# Patient Record
Sex: Male | Born: 1995 | Race: Black or African American | Hispanic: No | Marital: Single | Smoking: Current every day smoker
Health system: Southern US, Community
[De-identification: ages and names within clinical notes are randomized; demographics above are authoritative.]

## PROBLEM LIST (undated history)

## (undated) DIAGNOSIS — J45909 Unspecified asthma, uncomplicated: Secondary | ICD-10-CM

---
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
## 2013-12-26 ENCOUNTER — Encounter (HOSPITAL_COMMUNITY): Payer: Self-pay | Admitting: Emergency Medicine

## 2013-12-26 ENCOUNTER — Emergency Department (INDEPENDENT_AMBULATORY_CARE_PROVIDER_SITE_OTHER)
Admission: EM | Admit: 2013-12-26 | Discharge: 2013-12-26 | Disposition: A | Discharge status: Home / Self Care | Payer: Self-pay | Source: Home / Self Care | Attending: Emergency Medicine | Admitting: Emergency Medicine

## 2013-12-26 DIAGNOSIS — H6590 Unspecified nonsuppurative otitis media, unspecified ear: Secondary | ICD-10-CM

## 2013-12-26 DIAGNOSIS — H6591 Unspecified nonsuppurative otitis media, right ear: Secondary | ICD-10-CM

## 2013-12-26 MED ORDER — AMOXICILLIN 500 MG PO CAPS: 1000.0000 mg | ORAL_CAPSULE | Freq: Three times a day (TID) | ORAL | Status: AC

## 2013-12-26 MED ORDER — PREDNISONE 20 MG PO TABS: 20.0000 mg | ORAL_TABLET | Freq: Two times a day (BID) | ORAL | Status: AC

## 2013-12-26 MED ORDER — FLUTICASONE PROPIONATE 50 MCG/ACT NA SUSP: 2.0000 | Freq: Every day | NASAL | Status: AC

## 2013-12-26 NOTE — Discharge Instructions (Signed)
Otitis Media, Adult Otitis media is redness, soreness, and swelling (inflammation) of the middle ear. Otitis media may be caused by allergies or, most commonly, by infection. Often it occurs as a complication of the common cold. SIGNS AND SYMPTOMS Symptoms of otitis media may include:  Earache.  Fever.  Ringing in your ear.  Headache.  Leakage of fluid from the ear. DIAGNOSIS To diagnose otitis media, your health care provider will examine your ear with an otoscope. This is an instrument that allows your health care provider to see into your ear in order to examine your eardrum. Your health care provider also will ask you questions about your symptoms. TREATMENT  Typically, otitis media resolves on its own within 3 5 days. Your health care provider may prescribe medicine to ease your symptoms of pain. If otitis media does not resolve within 5 days or is recurrent, your health care provider may prescribe antibiotic medicines if he or she suspects that a bacterial infection is the cause. HOME CARE INSTRUCTIONS   Take your medicine as directed until it is gone, even if you feel better after the first few days.  Only take over-the-counter or prescription medicines for pain, discomfort, or fever as directed by your health care provider.  Follow up with your health care provider as directed. SEEK MEDICAL CARE IF:  You have otitis media only in one ear or bleeding from your nose or both.  You notice a lump on your neck.  You are not getting better in 3 5 days.  You feel worse instead of better. SEEK IMMEDIATE MEDICAL CARE IF:   You have pain that is not controlled with medicine.  You have swelling, redness, or pain around your ear or stiffness in your neck.  You notice that part of your face is paralyzed.  You notice that the bone behind your ear (mastoid) is tender when you touch it. MAKE SURE YOU:   Understand these instructions.  Will watch your condition.  Will get help  right away if you are not doing well or get worse. Document Released: 07/15/2004 Document Revised: 07/31/2013 Document Reviewed: 05/07/2013 [employer] Patient Information 2014 [employer].  

## 2013-12-26 NOTE — ED Notes (Signed)
Right ear pain for one week, denies any other uri symptom

## 2013-12-26 NOTE — ED Provider Notes (Signed)
  Chief Complaint   Chief Complaint  Patient presents with  . Otalgia    History of Present Illness   Dietrich PatesBobby J Behne is a 18 year old male who's had a one-week history of right ear pain and decreased hearing. He denies any drainage from the ear or recently getting water in the ear. He's had no nasal congestion, rhinorrhea, sneezing, or allergy history. He denies fever, chills, headache, sore throat, adenopathy, or cough. He has not had problems with his ear previously.  Review of Systems   Other than as noted above, the patient denies any of the following symptoms: Systemic:  No fevers, chills, or sweats. Eye:  No redness, pain, discharge, itching, blurred vision, or diplopia. ENT:  No headache, nasal congestion, sneezing, itching, epistaxis, ear pain, congestion, decreased hearing, ringing in ears, vertigo, or tinnitus.  No oral lesions, sore throat, pain on swallowing, or hoarseness. Neck:  No mass, tenderness or adenopathy. Lungs:  No coughing, wheezing, or shortness of breath. Skin:  No rash or itching.  PMFSH   Past medical history, family history, social history, meds, and allergies were reviewed.   Physical Examination     Vital signs:  BP 104/62  Pulse 64  Temp(Src) 97.9 F (36.6 C) (Oral)  Resp 14  SpO2 100% General:  Alert and oriented.  In no distress.  Skin warm and dry. Eye:  PERRL, full EOMs, lids and conjunctiva normal.   ENT:  His right TM was slightly erythematous and dull, and there is fluid behind the TM, no air-fluid level, the left TM was normal. Both canals were clear.  Nasal mucosa not congested and without drainage.  Mucous membranes moist, no oral lesions, normal dentition, pharynx clear.  No cranial or facial pain to palplation. Neck:  Supple, full ROM.  No adenopathy, tenderness or mass.  Thyroid normal. Lungs:  Breath sounds clear and equal bilaterally.  No wheezes, rales or rhonchi. Heart:  Rhythm regular, without extrasystoles.  No gallops or  murmers. Skin:  Clear, warm and dry.  Assessment   The encounter diagnosis was Right serous otitis media.  Plan    1.  Meds:  The following meds were prescribed:   New Prescriptions   AMOXICILLIN (AMOXIL) 500 MG CAPSULE    Take 2 capsules (1,000 mg total) by mouth 3 (three) times daily.   FLUTICASONE (FLONASE) 50 MCG/ACT NASAL SPRAY    Place 2 sprays into both nostrils daily.   PREDNISONE (DELTASONE) 20 MG TABLET    Take 1 tablet (20 mg total) by mouth 2 (two) times daily.    2.  Patient Education/Counseling:  The patient was given appropriate handouts, self care instructions, and instructed in symptomatic relief.  She instructed in Polizeration.  3.  Follow up:  The patient was told to follow up here if no better in 3 to 4 days, or sooner if becoming worse in any way, and given some red flag symptoms such as worsening pain or fever which would prompt immediate return.  Followup with Dr. Narda Bondshris Newman if no better in a month.     Reuben Likesavid C Raziel Koenigs, MD 12/26/13 [phone]

## 2016-03-29 ENCOUNTER — Emergency Department (HOSPITAL_COMMUNITY): Payer: Managed Care, Other (non HMO)

## 2016-03-29 ENCOUNTER — Encounter (HOSPITAL_COMMUNITY): Payer: Self-pay | Admitting: *Deleted

## 2016-03-29 ENCOUNTER — Emergency Department (HOSPITAL_COMMUNITY)
Admission: EM | Admit: 2016-03-29 | Discharge: 2016-03-29 | Disposition: A | Discharge status: Home / Self Care | Payer: Managed Care, Other (non HMO) | Attending: Emergency Medicine | Admitting: Emergency Medicine

## 2016-03-29 DIAGNOSIS — S0081XA Abrasion of other part of head, initial encounter: Secondary | ICD-10-CM | POA: Insufficient documentation

## 2016-03-29 DIAGNOSIS — Z7951 Long term (current) use of inhaled steroids: Secondary | ICD-10-CM | POA: Insufficient documentation

## 2016-03-29 DIAGNOSIS — Z79899 Other long term (current) drug therapy: Secondary | ICD-10-CM | POA: Diagnosis not present

## 2016-03-29 DIAGNOSIS — Z7952 Long term (current) use of systemic steroids: Secondary | ICD-10-CM | POA: Insufficient documentation

## 2016-03-29 DIAGNOSIS — Y998 Other external cause status: Secondary | ICD-10-CM | POA: Insufficient documentation

## 2016-03-29 DIAGNOSIS — J45909 Unspecified asthma, uncomplicated: Secondary | ICD-10-CM | POA: Diagnosis not present

## 2016-03-29 DIAGNOSIS — Z23 Encounter for immunization: Secondary | ICD-10-CM | POA: Diagnosis not present

## 2016-03-29 DIAGNOSIS — Y9389 Activity, other specified: Secondary | ICD-10-CM | POA: Insufficient documentation

## 2016-03-29 DIAGNOSIS — S61531A Puncture wound without foreign body of right wrist, initial encounter: Secondary | ICD-10-CM | POA: Insufficient documentation

## 2016-03-29 DIAGNOSIS — Y9289 Other specified places as the place of occurrence of the external cause: Secondary | ICD-10-CM | POA: Insufficient documentation

## 2016-03-29 DIAGNOSIS — F17200 Nicotine dependence, unspecified, uncomplicated: Secondary | ICD-10-CM | POA: Insufficient documentation

## 2016-03-29 DIAGNOSIS — S61411A Laceration without foreign body of right hand, initial encounter: Secondary | ICD-10-CM | POA: Insufficient documentation

## 2016-03-29 DIAGNOSIS — Z792 Long term (current) use of antibiotics: Secondary | ICD-10-CM | POA: Diagnosis not present

## 2016-03-29 DIAGNOSIS — S61451A Open bite of right hand, initial encounter: Secondary | ICD-10-CM | POA: Diagnosis present

## 2016-03-29 HISTORY — DX: Unspecified asthma, uncomplicated: J45.909

## 2016-03-29 MED ORDER — BACITRACIN ZINC 500 UNIT/GM EX OINT: 1.0000 "application " | TOPICAL_OINTMENT | Freq: Two times a day (BID) | CUTANEOUS | Status: AC

## 2016-03-29 MED ORDER — TETANUS-DIPHTH-ACELL PERTUSSIS 5-2.5-18.5 LF-MCG/0.5 IM SUSP
0.5000 mL | Freq: Once | INTRAMUSCULAR | Status: AC
  Administered 2016-03-29: 0.5 mL via INTRAMUSCULAR
  Filled 2016-03-29: qty 0.5

## 2016-03-29 MED ORDER — LIDOCAINE HCL (PF) 1 % IJ SOLN
5.0000 mL | Freq: Once | INTRAMUSCULAR | Status: AC
  Administered 2016-03-29: 5 mL
  Filled 2016-03-29: qty 5

## 2016-03-29 NOTE — Discharge Instructions (Signed)
Laceration Care, Adult °A laceration is a cut that goes through all of the layers of the skin and into the tissue that is right under the skin. Some lacerations heal on their own. Others need to be closed with stitches (sutures), staples, skin adhesive strips, or skin glue. Proper laceration care minimizes the risk of infection and helps the laceration to heal better. °HOW TO CARE FOR YOUR LACERATION °If sutures or staples were used: °· Keep the wound clean and dry. °· If you were given a bandage (dressing), you should change it at least one time per day or as told by your health care provider. You should also change it if it becomes wet or dirty. °· Keep the wound completely dry for the first 24 hours or as told by your health care provider. After that time, you may shower or bathe. However, make sure that the wound is not soaked in water until after the sutures or staples have been removed. °· Clean the wound one time each day or as told by your health care provider: °· Wash the wound with soap and water. °· Rinse the wound with water to remove all soap. °· Pat the wound dry with a clean towel. Do not rub the wound. °· After cleaning the wound, apply a thin layer of antibiotic ointment as told by your health care provider. This will help to prevent infection and keep the dressing from sticking to the wound. °· Have the sutures or staples removed as told by your health care provider. °If skin adhesive strips were used: °· Keep the wound clean and dry. °· If you were given a bandage (dressing), you should change it at least one time per day or as told by your health care provider. You should also change it if it becomes dirty or wet. °· Do not get the skin adhesive strips wet. You may shower or bathe, but be careful to keep the wound dry. °· If the wound gets wet, pat it dry with a clean towel. Do not rub the wound. °· Skin adhesive strips fall off on their own. You may trim the strips as the wound heals. Do not  remove skin adhesive strips that are still stuck to the wound. They will fall off in time. °If skin glue was used: °· Try to keep the wound dry, but you may briefly wet it in the shower or bath. Do not soak the wound in water, such as by swimming. °· After you have showered or bathed, gently pat the wound dry with a clean towel. Do not rub the wound. °· Do not do any activities that will make you sweat heavily until the skin glue has fallen off on its own. °· Do not apply liquid, cream, or ointment medicine to the wound while the skin glue is in place. Using those may loosen the film before the wound has healed. °· If you were given a bandage (dressing), you should change it at least one time per day or as told by your health care provider. You should also change it if it becomes dirty or wet. °· If a dressing is placed over the wound, be careful not to apply tape directly over the skin glue. Doing that may cause the glue to be pulled off before the wound has healed. °· Do not pick at the glue. The skin glue usually remains in place for 5-10 days, then it falls off of the skin. °General Instructions °· Take over-the-counter and prescription   medicines only as told by your health care provider.  If you were prescribed an antibiotic medicine or ointment, take or apply it as told by your doctor. Do not stop using it even if your condition improves.  To help prevent scarring, make sure to cover your wound with sunscreen whenever you are outside after stitches are removed, after adhesive strips are removed, or when glue remains in place and the wound is healed. Make sure to wear a sunscreen of at least 30 SPF.  Do not scratch or pick at the wound.  Keep all follow-up visits as told by your health care provider. This is important.  Check your wound every day for signs of infection. Watch for:  Redness, swelling, or pain.  Fluid, blood, or pus.  Raise (elevate) the injured area above the level of your heart  while you are sitting or lying down, if possible. SEEK MEDICAL CARE IF:  You received a tetanus shot and you have swelling, severe pain, redness, or bleeding at the injection site.  You have a fever.  A wound that was closed breaks open.  You notice a bad smell coming from your wound or your dressing.  You notice something coming out of the wound, such as wood or glass.  Your pain is not controlled with medicine.  You have increased redness, swelling, or pain at the site of your wound.  You have fluid, blood, or pus coming from your wound.  You notice a change in the color of your skin near your wound.  You need to change the dressing frequently due to fluid, blood, or pus draining from the wound.  You develop a new rash.  You develop numbness around the wound. SEEK IMMEDIATE MEDICAL CARE IF:  You develop severe swelling around the wound.  Your pain suddenly increases and is severe.  You develop painful lumps near the wound or on skin that is anywhere on your body.  You have a red streak going away from your wound.  The wound is on your hand or foot and you cannot properly move a finger or toe.  The wound is on your hand or foot and you notice that your fingers or toes look pale or bluish.   This information is not intended to replace advice given to you by your health care provider. Make sure you discuss any questions you have with your health care provider.   Document Released: 10/10/2005 Document Revised: 02/24/2015 Document Reviewed: 10/06/2014 Elsevier Interactive Patient Education 2016 [employer] Human Bite Human bite wounds tend to become infected, even when they seem minor at first. Infection can develop quickly, sometimes in a matter of hours. Bite wounds of the hand have a higher chance of infection compared to bites in other places and can be serious because the tendons and joints are close to the skin. SYMPTOMS  Common symptoms of a human bite include:     Bruising.   Broken skin.  Bleeding.  Pain. DIAGNOSIS  This condition may be diagnosed based on a physical exam and medical history. Your health care provider will examine the wound and ask for details about how the bite happened. If you have details about the medical history of the person who bit you, it is important to tell your health care provider. This will help determine if there is any chance that a disease may have been spread. You may have tests, such as:   Blood tests. This may be done if there is a  chance of infection from diseases such as hepatitis or HIV.  X-rays to check for damage to bones or joints.  Culture test. This uses a sample of fluid from the wound to check for infection. TREATMENT  Treatment varies based on the location and severity of the bite and your medical history. Treatment may include:   Wound care. This often includes cleaning the wound, flushing the wound with saline solution, and applying a bandage (dressing). Sometimes, the wound is left open to heal because of the high risk of infection. However, in some cases, the wound may be closed with stitches (sutures), staples, skin glue, or adhesive strips.  Antibiotic medicine.  A flexible cast (splint).  Tetanus shot. In some cases, bites that have become infected may require IV antibiotics and surgical treatment in the hospital.  HOME CARE INSTRUCTIONS Wound Care  Follow instructions from your health care provider about how to take care of your wound. Make sure you:  Wash your hands with soap and water before you change your dressing. If soap and water are not available, use hand sanitizer.  Change your dressing as told by your health care provider.  Leave sutures, skin glue, or adhesive strips in place. These skin closures may need to be in place for 2 weeks or longer. If adhesive strip edges start to loosen and curl up, you may trim the loose edges. Do not remove adhesive strips completely  unless your health care provider tells you to do that.  Check your wound every day for signs of infection. Watch for:  Increasing redness, swelling, or pain.  Fluid, blood, or pus. General Instructions  Take or apply over-the-counter and prescription medicines only as told by your health care provider.  If you were prescribed an antibiotic, take or apply it as told by your health care provider. Do not stop using the antibiotic even if your condition improves.  Keep the injured area raised (elevated) above the level of your heart while you are sitting or lying down, if this is possible.  If directed, apply ice to the injured area:  Put ice in a plastic bag.  Place a towel between your skin and the bag.  Leave the ice on for 20 minutes, 2-3 times per day.  Keep all follow-up visits as told by your health care provider. This is important. SEEK MEDICAL CARE IF:  You have chills.   You have pain when you move your injured area.  You have trouble moving your injured area.   You are not improving, or you are getting worse.  SEEK IMMEDIATE MEDICAL CARE IF:  You have increasing fluid, blood, or pus coming from your wound.  You have increasing redness, swelling, or pain at the site of your wound.   You have a red streak extending away from your wound.  You have a fever.    This information is not intended to replace advice given to you by your health care provider. Make sure you discuss any questions you have with your health care provider.   Document Released: 11/17/2004 Document Revised: 07/01/2015 Document Reviewed: 02/25/2015 Elsevier Interactive Patient Education [employer]

## 2016-03-29 NOTE — ED Notes (Signed)
Pt states he understands instructions. Discharge with [facility], Ambulatory.
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
## 2016-03-29 NOTE — ED Notes (Signed)
EDP at bedside  
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
## 2016-03-29 NOTE — ED Notes (Addendum)
PA-C to see patient before RN assessment. See PA note.

## 2016-06-16 ENCOUNTER — Emergency Department (HOSPITAL_COMMUNITY)
Admission: EM | Admit: 2016-06-16 | Discharge: 2016-06-17 | Disposition: A | Discharge status: Home / Self Care | Payer: Managed Care, Other (non HMO) | Attending: Emergency Medicine | Admitting: Emergency Medicine

## 2016-06-16 ENCOUNTER — Encounter (HOSPITAL_COMMUNITY): Payer: Self-pay

## 2016-06-16 ENCOUNTER — Emergency Department (HOSPITAL_COMMUNITY): Payer: Managed Care, Other (non HMO)

## 2016-06-16 DIAGNOSIS — X501XXA Overexertion from prolonged static or awkward postures, initial encounter: Secondary | ICD-10-CM | POA: Insufficient documentation

## 2016-06-16 DIAGNOSIS — S92251A Displaced fracture of navicular [scaphoid] of right foot, initial encounter for closed fracture: Secondary | ICD-10-CM | POA: Diagnosis not present

## 2016-06-16 DIAGNOSIS — J45909 Unspecified asthma, uncomplicated: Secondary | ICD-10-CM | POA: Insufficient documentation

## 2016-06-16 DIAGNOSIS — F17200 Nicotine dependence, unspecified, uncomplicated: Secondary | ICD-10-CM | POA: Diagnosis not present

## 2016-06-16 DIAGNOSIS — Y929 Unspecified place or not applicable: Secondary | ICD-10-CM | POA: Insufficient documentation

## 2016-06-16 DIAGNOSIS — Y999 Unspecified external cause status: Secondary | ICD-10-CM | POA: Diagnosis not present

## 2016-06-16 DIAGNOSIS — Y9339 Activity, other involving climbing, rappelling and jumping off: Secondary | ICD-10-CM | POA: Diagnosis not present

## 2016-06-16 DIAGNOSIS — S99921A Unspecified injury of right foot, initial encounter: Secondary | ICD-10-CM | POA: Diagnosis present

## 2016-06-16 DIAGNOSIS — M79671 Pain in right foot: Secondary | ICD-10-CM

## 2016-06-16 NOTE — ED Triage Notes (Signed)
Pt states that he went for a run and twisted his foot wrong. Swelling to the upper foot. Pain 5/10

## 2016-06-17 MED ORDER — HYDROCODONE-ACETAMINOPHEN 5-325 MG PO TABS
2.0000 | ORAL_TABLET | Freq: Once | ORAL | Status: AC
  Administered 2016-06-17: 2 via ORAL
  Filled 2016-06-17: qty 2

## 2016-06-17 MED ORDER — HYDROCODONE-ACETAMINOPHEN 5-325 MG PO TABS: 1.0000 | ORAL_TABLET | ORAL | 0 refills | Status: AC | PRN

## 2016-06-17 NOTE — ED Notes (Signed)
Ortho tech in room with pt 

## 2016-06-17 NOTE — ED Notes (Signed)
Ortho in room

## 2016-06-17 NOTE — ED Provider Notes (Signed)
[facility]-EMERGENCY DEPT Provider Note   CSN: [id number] Arrival date & time: 06/16/16  2231     History   Chief Complaint Chief Complaint  Patient presents with  . Foot Pain    HPI Wayne Fowler is a 20 y.o. male.  HPI   Patient is a 20 year old male who presents the emergency department with right foot pain that occurred after jumping out of a tree roughly 30 mins prior to arrival. Patient landed with his foot inverted. He was able to walk but 20 minutes later began to experience pain. At rest pain is constant, throbbing, 7/10. Pain worse with walking, sharp, 10/10. Associated swelling. Patient denies other symptoms.  Past Medical History:  Diagnosis Date  . Asthma     There are no active problems to display for this patient.   History reviewed. No pertinent surgical history.     Home Medications    Prior to Admission medications   Medication Sig Start Date End Date Taking? Authorizing Provider  albuterol (PROVENTIL HFA;VENTOLIN HFA) 108 (90 BASE) MCG/ACT inhaler Inhale into the lungs every 6 (six) hours as needed for wheezing or shortness of breath.    Historical Provider, MD  amoxicillin (AMOXIL) 500 MG capsule Take 2 capsules (1,000 mg total) by mouth 3 (three) times daily. 12/26/13   Reuben Likes, MD  bacitracin ointment Apply 1 application topically 2 (two) times daily. 03/29/16   Everlene Farrier, PA-C  fluticasone (FLONASE) 50 MCG/ACT nasal spray Place 2 sprays into both nostrils daily. 12/26/13   Reuben Likes, MD  HYDROcodone-acetaminophen (NORCO/VICODIN) 5-325 MG tablet Take 1 tablet by mouth every 4 (four) hours as needed. 06/17/16   Jerre Simon, PA  predniSONE (DELTASONE) 20 MG tablet Take 1 tablet (20 mg total) by mouth 2 (two) times daily. 12/26/13   Reuben Likes, MD    Family History No family history on file.  Social History Social History  Substance Use Topics  . Smoking status: Current Every Day Smoker  . Smokeless tobacco: Never Used  . Alcohol  use No     Allergies   Review of patient's allergies indicates no known allergies.   Review of Systems Review of Systems  Constitutional: Negative for fever.  Gastrointestinal: Negative for nausea and vomiting.  Musculoskeletal: Positive for arthralgias and joint swelling.  Skin: Negative for wound.  Neurological: Negative for weakness and numbness.     Physical Exam Updated Vital Signs BP 121/76   Pulse 91   Temp 98.1 F (36.7 C) (Oral)   Resp 16   Ht 6\' 2"  (1.88 m)   Wt 73.5 kg   SpO2 100%   BMI 20.80 kg/m   Physical Exam  Constitutional: He appears well-developed and well-nourished. No distress.  HENT:  Head: Normocephalic and atraumatic.  Eyes: Conjunctivae are normal.  Pulmonary/Chest: Effort normal. No respiratory distress.  Musculoskeletal: Normal range of motion.  Examination of right foot revealed moderate edema with mild ecchymosis noted to the lateral aspect of right foot just distal to the lateral malleoli, no deformity. Limited range of motion due to pain. Sensation intact. Patient neurovascularly intact distally  Neurological: He is alert. Coordination normal.  Skin: Skin is warm and dry. He is not diaphoretic.  Psychiatric: He has a normal mood and affect. His behavior is normal.  Nursing note and vitals reviewed.    ED Treatments / Results  Labs (all labs ordered are listed, but only abnormal results are displayed) Labs Reviewed - No data to display  EKG  EKG Interpretation None       Radiology Dg Foot Complete Right  Result Date: 06/16/2016 CLINICAL DATA:  Generalized right foot pain after injury running today. Initial encounter. EXAM: RIGHT FOOT COMPLETE - 3+ VIEW COMPARISON:  None. FINDINGS: There is a bone fragment dorsal to the navicular which has irregular margins and probable donor site. Regional soft tissues are thickened/edematous. No acute malalignment. Hallux valgus. IMPRESSION: Dorsal navicular avulsion fracture. Electronically  Signed   By: Marnee SpringJonathon  Watts M.D.   On: 06/16/2016 23:08    Procedures Procedures (including critical care time)  Medications Ordered in ED Medications  HYDROcodone-acetaminophen (NORCO/VICODIN) 5-325 MG per tablet 2 tablet (2 tablets Oral Given 06/17/16 0036)     Initial Impression / Assessment and Plan / ED Course  I have reviewed the triage vital signs and the nursing notes.  Pertinent labs & imaging results that were available during my care of the patient were reviewed by me and considered in my medical decision making (see chart for details).  Clinical Course   Patient with dorsal navicular avulsion fracture. Patient neurovascularly intact distally. Placed patient in a cam walker boot and given crutches and instructed to be nonweightbearing. Discussed RICE and pain medication. Instructed patient to follow up with orthopedics, Dr. Magnus IvanBlackman, tomorrow. Discussed strict return precautions. Patient expressed understanding to the discharge instructions.  Patient case discussed with Dr. Patria Maneampos who agrees with the above plan.  Final Clinical Impressions(s) / ED Diagnoses   Final diagnoses:  Avulsion fracture of navicular bone of foot, right, closed, initial encounter  Right foot pain    New Prescriptions New Prescriptions   HYDROCODONE-ACETAMINOPHEN (NORCO/VICODIN) 5-325 MG TABLET    Take 1 tablet by mouth every 4 (four) hours as needed.     Jerre SimonJessica L Neshia Mckenzie, PA 06/17/16 [id number]    Azalia BilisKevin Campos, MD 06/17/16 [phone]

## 2016-06-17 NOTE — Discharge Instructions (Signed)
You were seen today for a dorsal navicular avulsion fracture. Wear the cam walker boot at all times except to shower. Do not bear weight on your right foot. Use crutches at all times when walking. Use ice on your foot for swelling and pain as often as you can, 20 minutes on, 20 minutes off, and be sure to keep a thin cloth between your skin and the ice. Try to keep your foot elevated as often as you can. Take Norco as needed for pain. Take a stool softener because Norco can cause constipation.  Follow-up with Dr. Doroteo GlassmanBlackmon's office tomorrow to schedule an appointment to be seen as soon as possible. Return to the emergency department if you experience uncontrolled pain, fever, numbness in your foot or any other concerning symptoms.

## 2016-06-17 NOTE — ED Notes (Addendum)
Pt verbalized understanding of d/c instructions and has no further questions. Pt is stable, A&Ox4, VSS. Pt girlfriend driving home.

## 2016-10-01 IMAGING — CR DG FOOT COMPLETE 3+V*R*
3 series · 3 of 3 positions shown · non-contrast
Comparison: None.

CLINICAL DATA: Generalized right foot pain after injury running
today. Initial encounter.

EXAM:
RIGHT FOOT COMPLETE - 3+ VIEW

[foot ap]
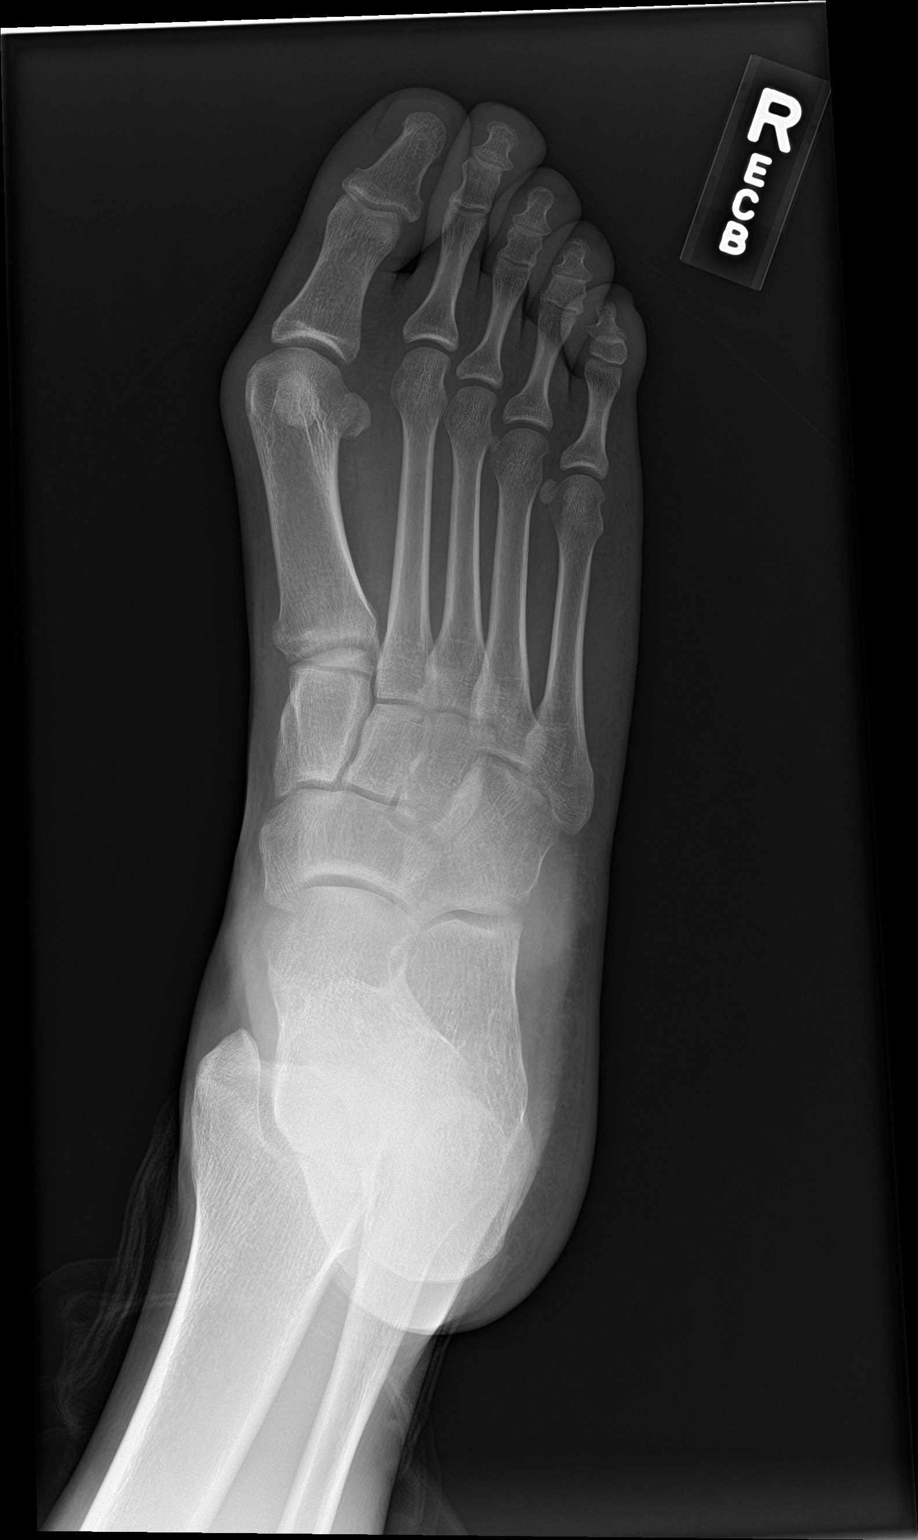

[foot obl]
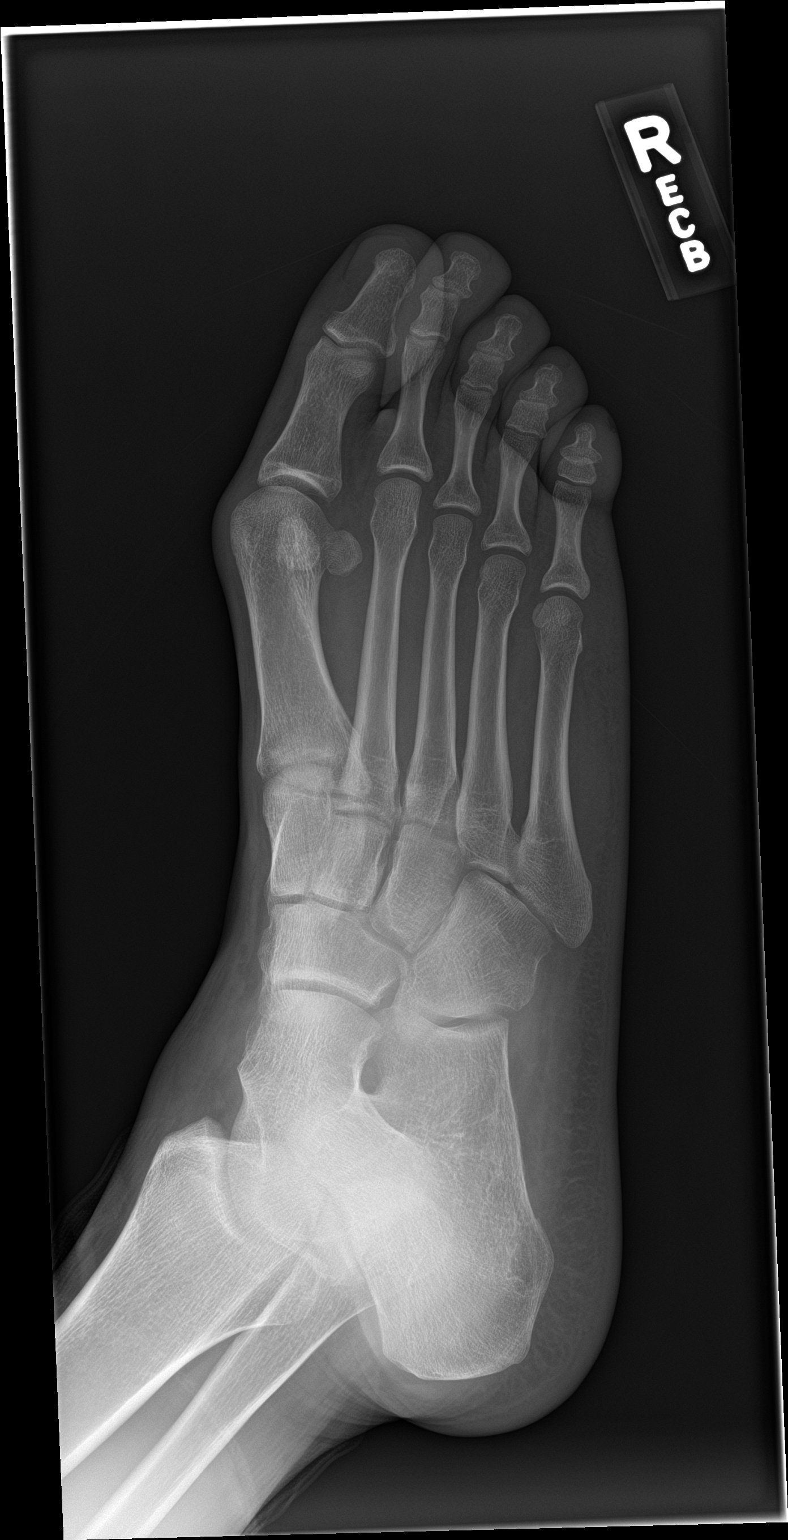

[foot lat]
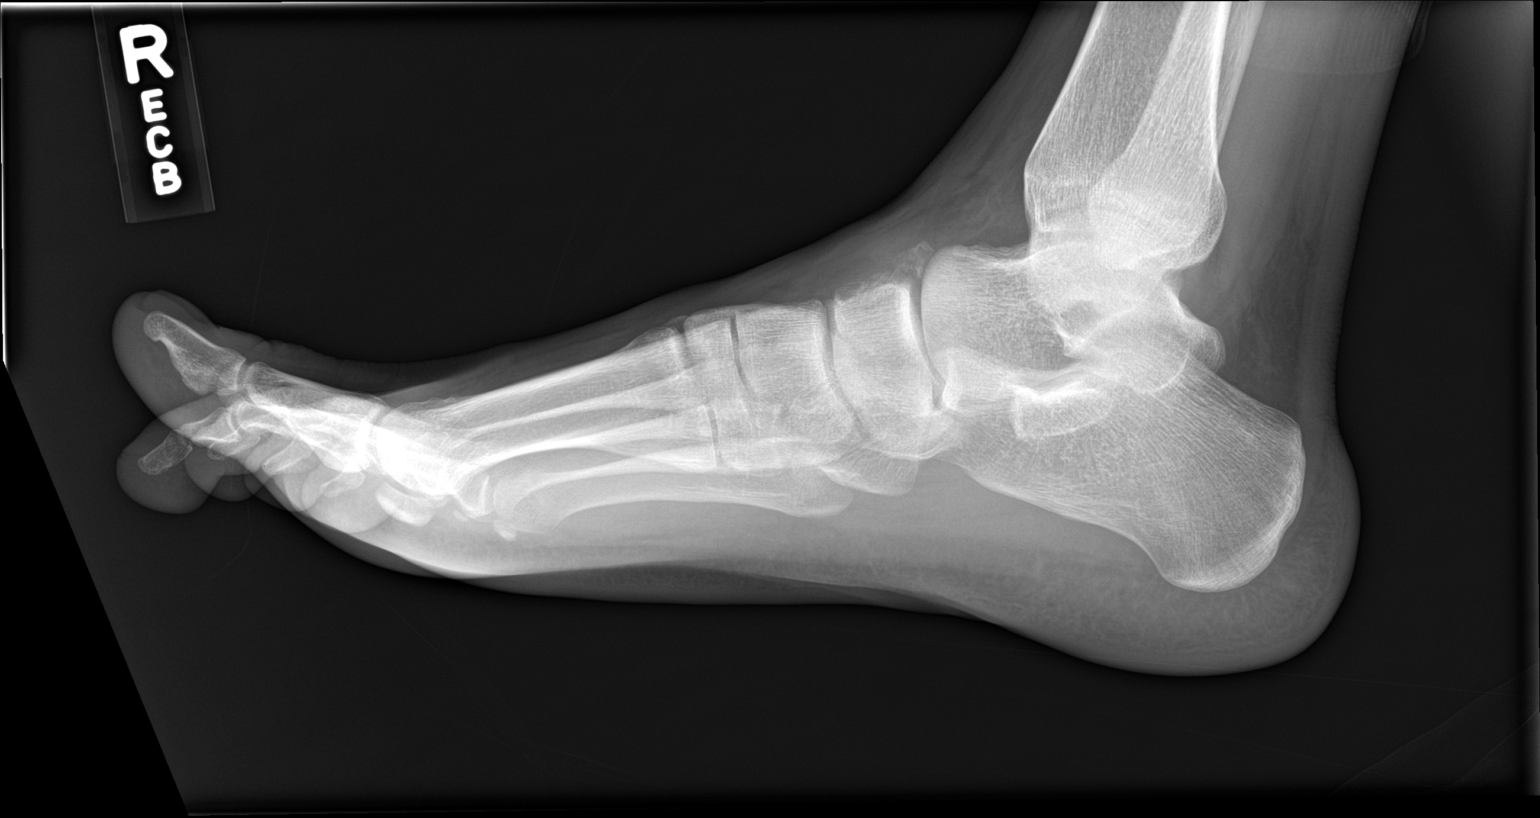

[3 of 3 positions shown; findings below may reference images not displayed]

FINDINGS: There is a bone fragment dorsal to the navicular which has irregular
margins and probable donor site. Regional soft tissues are
thickened/edematous. No acute malalignment. Hallux valgus.
IMPRESSION: Dorsal navicular avulsion fracture.

## 2021-12-22 DEATH — deceased
